# Patient Record
Sex: Male | Born: 2000 | Race: White | Hispanic: No | Marital: Single | Smoking: Current some day smoker
Health system: Southern US, Community
[De-identification: ages and names within clinical notes are randomized; demographics above are authoritative.]

## PROBLEM LIST (undated history)

## (undated) DIAGNOSIS — F419 Anxiety disorder, unspecified: Secondary | ICD-10-CM

---
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
## 2021-03-13 IMAGING — US US ABDOMEN LIMITED
1 series · 14 of 25 positions shown · non-contrast
Comparison: None.

CLINICAL DATA: 18-year-old male with right upper quadrant abdominal
pain.

EXAM:
ULTRASOUND ABDOMEN LIMITED RIGHT UPPER QUADRANT

[Series 1: us abdomen limited · 14 of 70 slices shown]
[im 1/70]
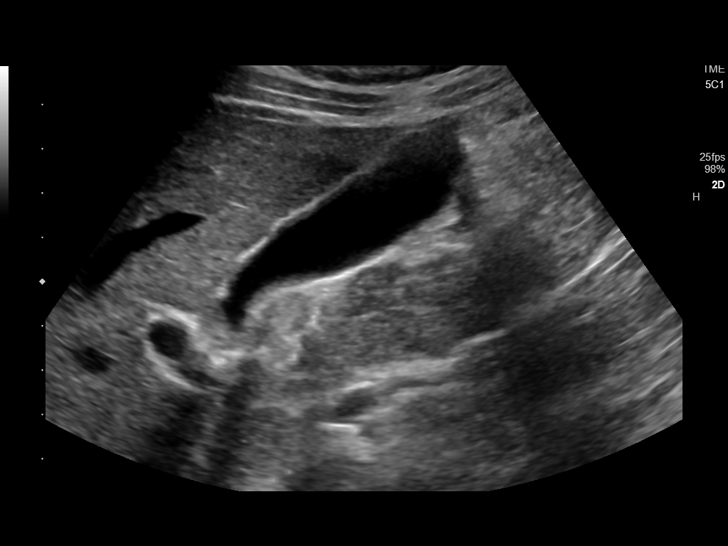
[im 6/70]
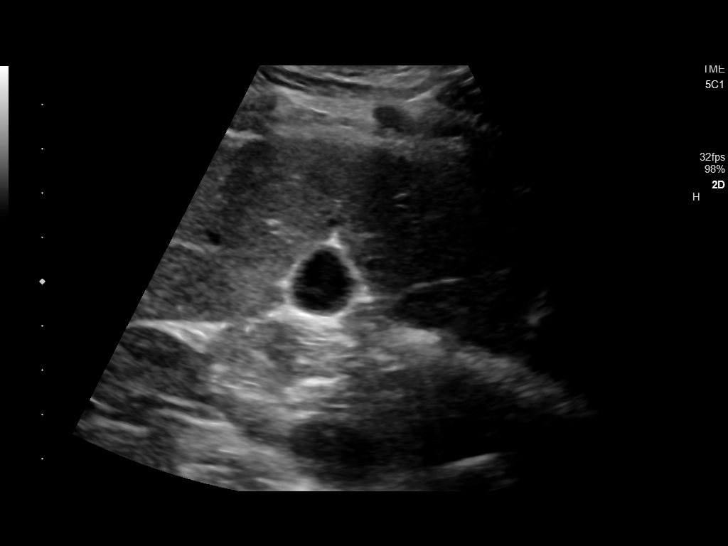
[im 12/70]
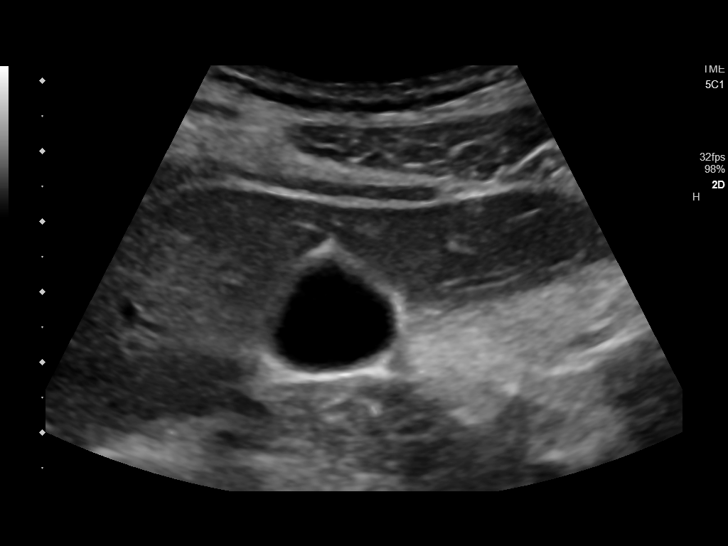
[im 18/70]
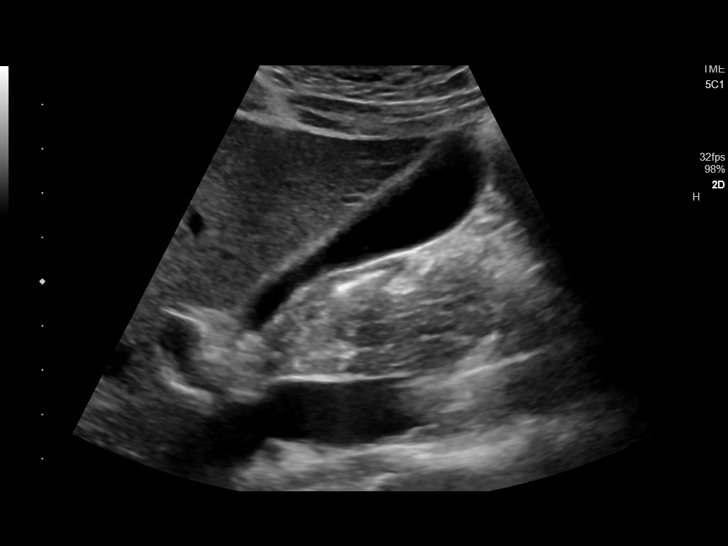
[im 24/70]
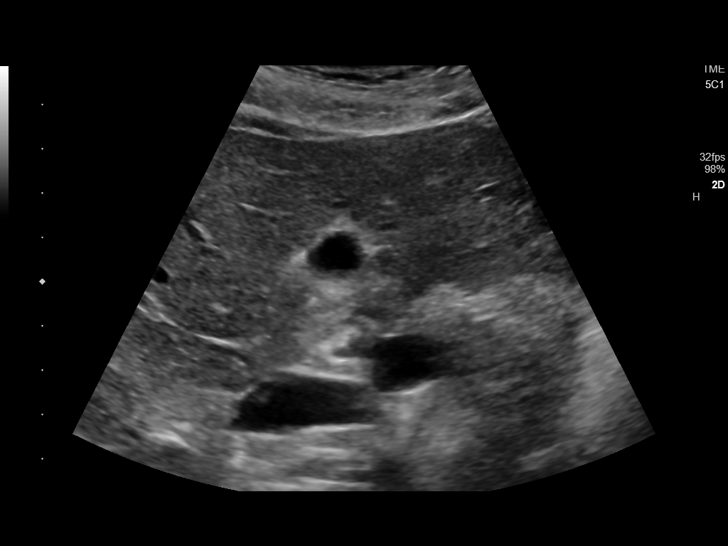
[im 26/70]
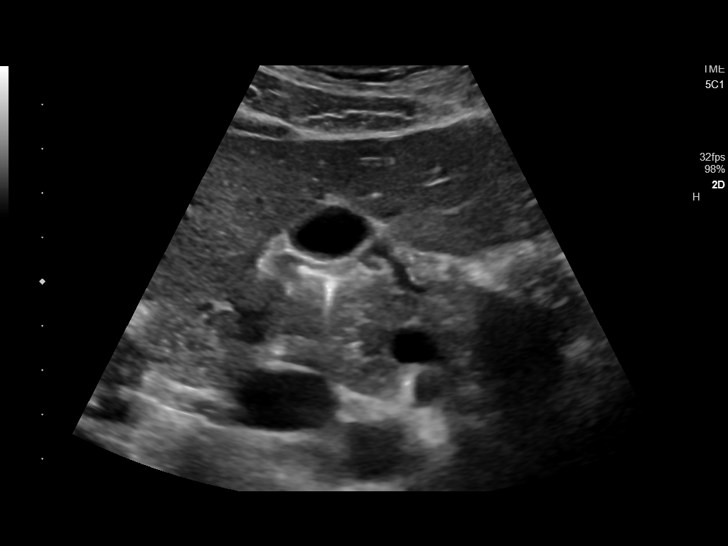
[im 32/70]
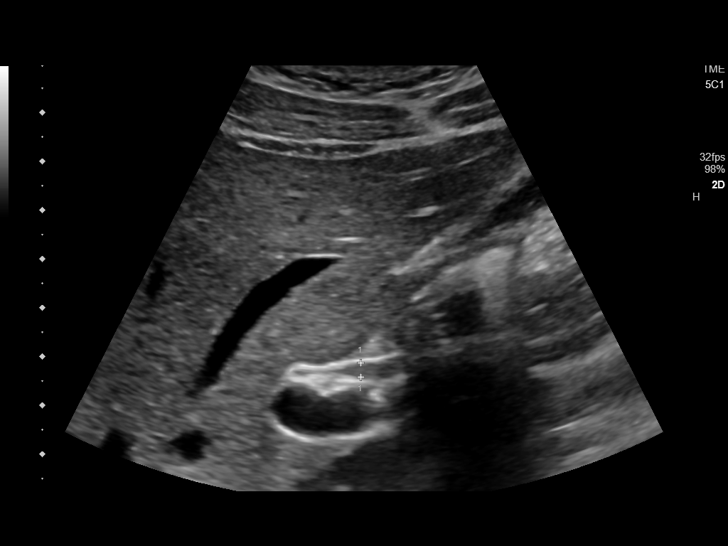
[im 38/70]
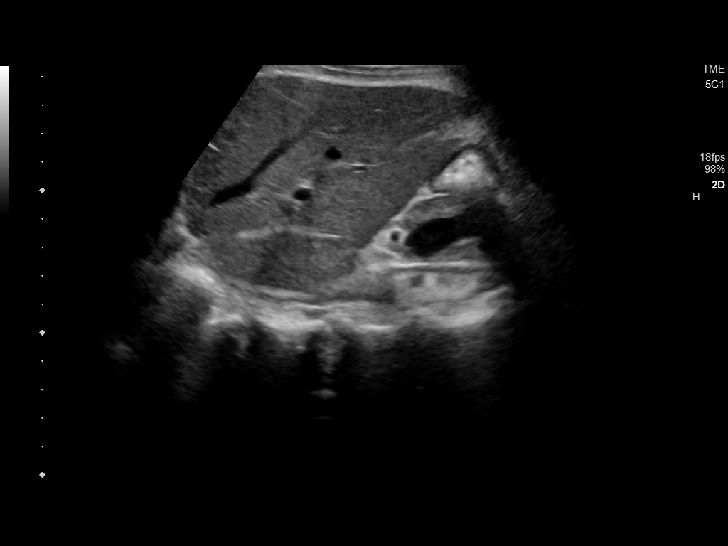
[im 44/70]
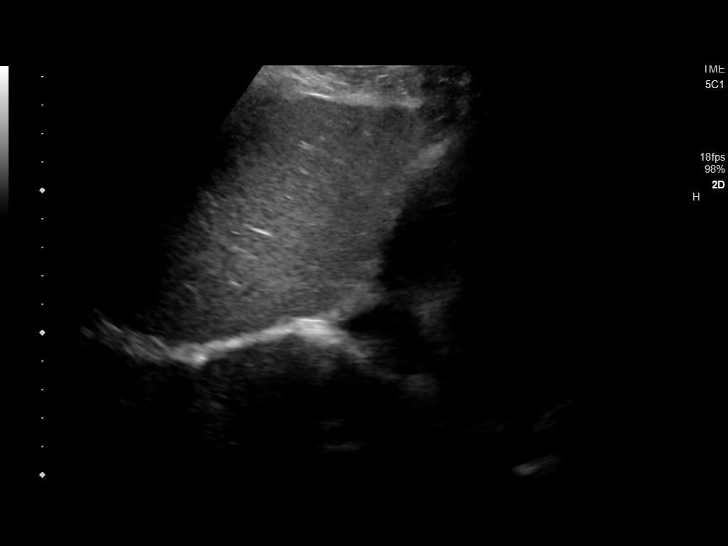
[im 47/70]
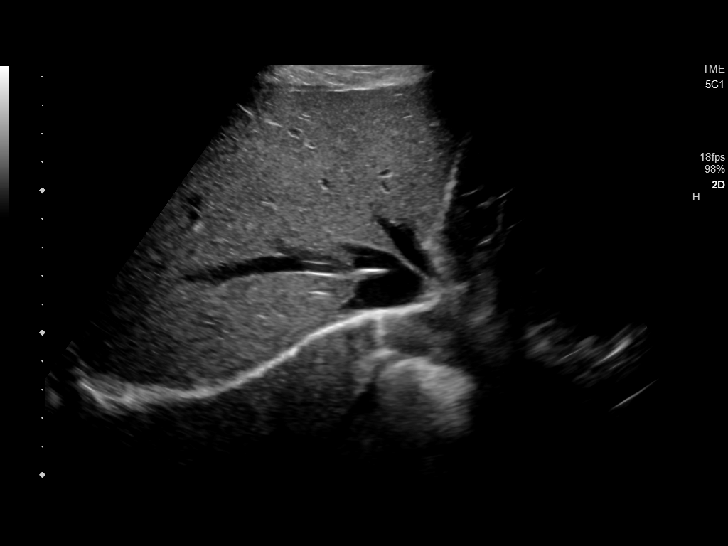
[im 52/70]
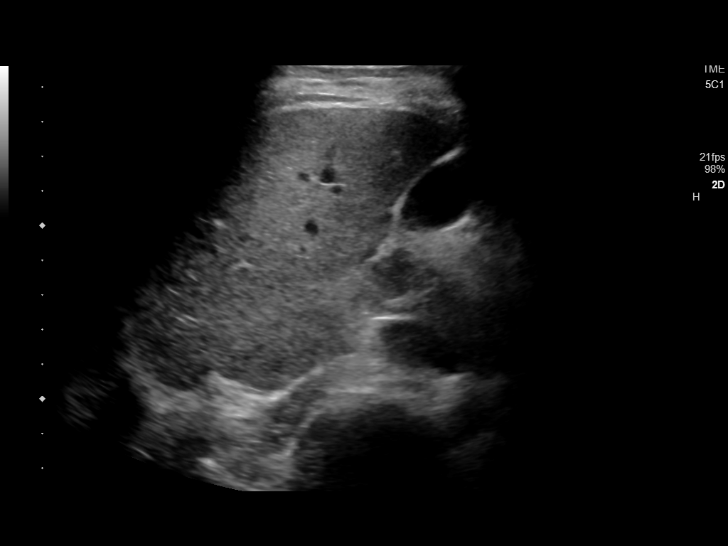
[im 58/70]
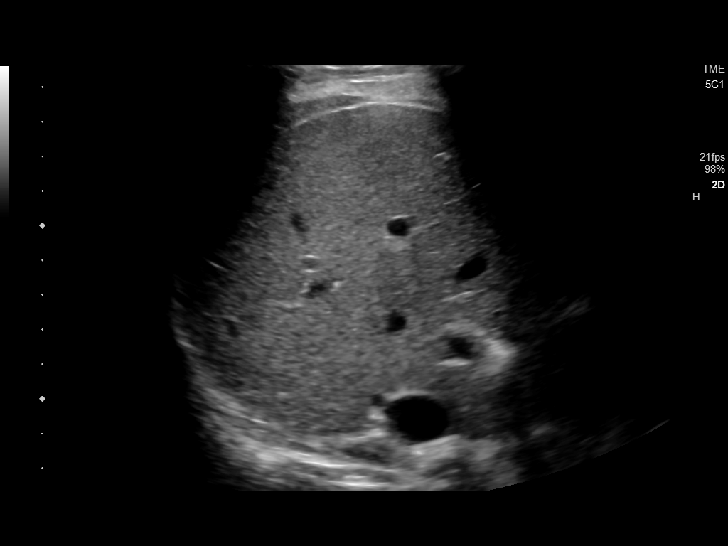
[im 64/70]
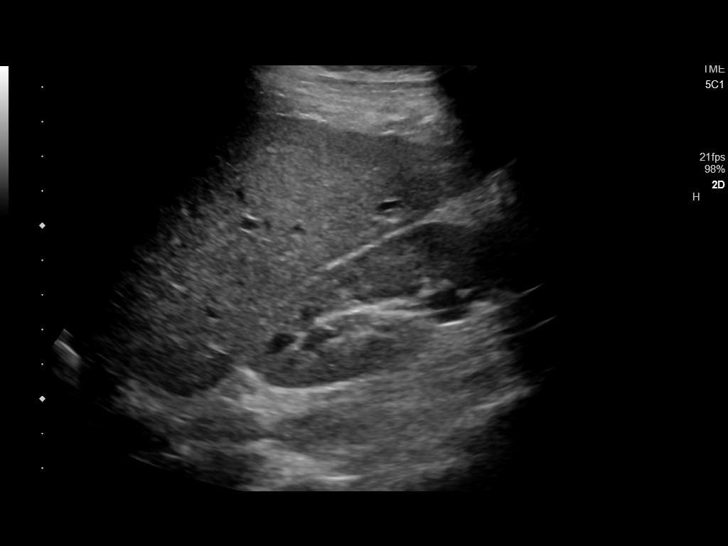
[im 70/70]
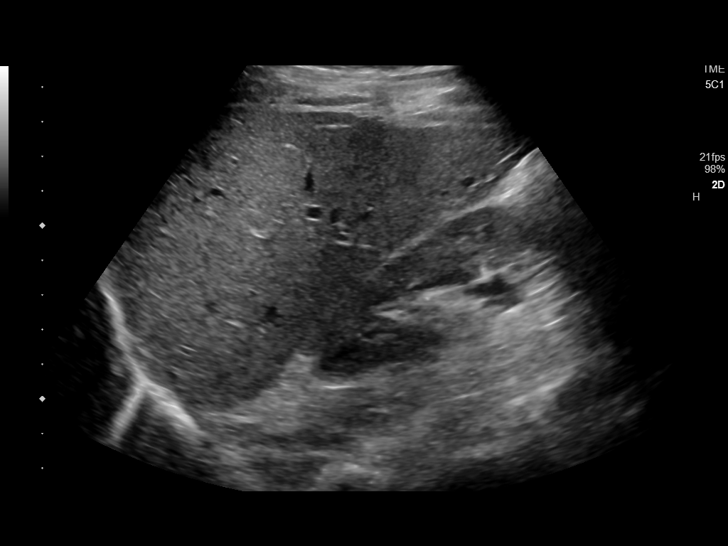

[14 of 25 positions shown; findings below may reference images not displayed]

FINDINGS: Gallbladder:

No gallstones or wall thickening visualized. No sonographic Murphy
sign noted by sonographer.

Common bile duct:

Diameter: 3 mm

Liver:

The liver is unremarkable. Portal vein is patent on color Doppler
imaging with normal direction of blood flow towards the liver.

Other: None.
IMPRESSION: Unremarkable right upper quadrant ultrasound.

## 2022-01-14 ENCOUNTER — Emergency Department (HOSPITAL_COMMUNITY)
Admission: EM | Admit: 2022-01-14 | Discharge: 2022-01-15 | Disposition: A | Discharge status: Home / Self Care | Payer: BLUE CROSS/BLUE SHIELD | Attending: Emergency Medicine | Admitting: Emergency Medicine

## 2022-01-14 ENCOUNTER — Other Ambulatory Visit: Payer: Self-pay

## 2022-01-14 ENCOUNTER — Encounter (HOSPITAL_COMMUNITY): Payer: Self-pay | Admitting: Emergency Medicine

## 2022-01-14 DIAGNOSIS — Z20822 Contact with and (suspected) exposure to covid-19: Secondary | ICD-10-CM | POA: Diagnosis not present

## 2022-01-14 DIAGNOSIS — F321 Major depressive disorder, single episode, moderate: Secondary | ICD-10-CM

## 2022-01-14 DIAGNOSIS — F329 Major depressive disorder, single episode, unspecified: Secondary | ICD-10-CM | POA: Insufficient documentation

## 2022-01-14 DIAGNOSIS — F4329 Adjustment disorder with other symptoms: Secondary | ICD-10-CM

## 2022-01-14 DIAGNOSIS — Z046 Encounter for general psychiatric examination, requested by authority: Secondary | ICD-10-CM | POA: Diagnosis present

## 2022-01-14 DIAGNOSIS — F419 Anxiety disorder, unspecified: Secondary | ICD-10-CM

## 2022-01-14 DIAGNOSIS — R45851 Suicidal ideations: Secondary | ICD-10-CM | POA: Insufficient documentation

## 2022-01-14 DIAGNOSIS — F4322 Adjustment disorder with anxiety: Secondary | ICD-10-CM | POA: Diagnosis not present

## 2022-01-14 LAB — ETHANOL: Alcohol, Ethyl (B): 10 mg/dL — ABNORMAL HIGH (ref <10)

## 2022-01-14 LAB — COMPREHENSIVE METABOLIC PANEL
ALT: 18 U/L (ref 0–44)
AST: 26 U/L (ref 15–41)
Albumin: 4.1 g/dL (ref 3.5–5.0)
Alkaline Phosphatase: 81 U/L (ref 38–126)
Anion gap: 11 (ref 5–15)
BUN: 6 mg/dL (ref 6–20)
CO2: 25 mmol/L (ref 22–32)
Calcium: 9.1 mg/dL (ref 8.9–10.3)
Chloride: 100 mmol/L (ref 98–111)
Creatinine, Ser: 0.89 mg/dL (ref 0.61–1.24)
GFR, Estimated: >60 mL/min (ref >60)
Glucose, Bld: 115 mg/dL — ABNORMAL HIGH (ref 70–99)
Potassium: 3.6 mmol/L (ref 3.5–5.1)
Sodium: 136 mmol/L (ref 135–145)
Total Bilirubin: 0.4 mg/dL (ref 0.3–1.2)
Total Protein: 7.1 g/dL (ref 6.5–8.1)

## 2022-01-14 LAB — RAPID URINE DRUG SCREEN, HOSP PERFORMED
Amphetamines: NOT DETECTED
Barbiturates: NOT DETECTED
Benzodiazepines: NOT DETECTED
Cocaine: NOT DETECTED
Opiates: NOT DETECTED
Tetrahydrocannabinol: NOT DETECTED

## 2022-01-14 LAB — CBC
HCT: 42.3 % (ref 39.0–52.0)
Hemoglobin: 14.6 g/dL (ref 13.0–17.0)
MCH: 32.4 pg (ref 26.0–34.0)
MCHC: 34.5 g/dL (ref 30.0–36.0)
MCV: 94 fL (ref 80.0–100.0)
Platelets: 335 10*3/uL (ref 150–400)
RBC: 4.5 MIL/uL (ref 4.22–5.81)
RDW: 13.2 % (ref 11.5–15.5)
WBC: 7.4 10*3/uL (ref 4.0–10.5)
nRBC: 0 % (ref 0.0–0.2)

## 2022-01-14 LAB — RESP PANEL BY RT-PCR (FLU A&B, COVID) ARPGX2
Influenza A by PCR: NEGATIVE
Influenza B by PCR: NEGATIVE
SARS Coronavirus 2 by RT PCR: NEGATIVE

## 2022-01-14 LAB — ACETAMINOPHEN LEVEL: Acetaminophen (Tylenol), Serum: <10 ug/mL — ABNORMAL LOW (ref 10–30)

## 2022-01-14 LAB — SALICYLATE LEVEL: Salicylate Lvl: <7 mg/dL — ABNORMAL LOW (ref 7.0–30.0)

## 2022-01-14 NOTE — BH Assessment (Signed)
Comprehensive Clinical Assessment (CCA) Note  01/15/2022 Ignace Takemura [id number]  Disposition: Quintella Reichert, NP, recommends overnight observation for safety and stabilization with psych reassessment in the AM. Romelle Starcher, RN, informed of disposition.  The patient demonstrates the following risk factors for suicide: Chronic risk factors for suicide include: psychiatric disorder of anxiety . Acute risk factors for suicide include:  breakup with girlfriend . Protective factors for this patient include: positive social support, responsibility to others (children, family), coping skills, hope for the future, and life satisfaction. Considering these factors, the overall suicide risk at this point appears to be moderate. Patient is appropriate for outpatient follow up.  [facility] from 01/14/2022 in [place] High Risk      Bellamy Beiler is a 21 year old male presenting voluntary to [facility] due to [place] with plan to shoot self or driving off road. Patient reported history of anxiety. Patient reported he was driving his mother to the airport when he experienced SI and a panic attack. Patient shared with mother whom then brought patient to [facility]. Patient reported today was his first time experiencing SI. Patient reported stressors include difficult break up with girlfriend 2 weeks ago of 1.5 years, increased sadness reported. Mother reported that also their family dog died 9 month ago. Patient also admitted to drinking, approximately 10-20 drinks 4 nights weekly, with increased drinking during birthday weekend. Patient denied prior psych hospitalizations, suicide attempts and self-harming behaviors. Patient denied HI and psychosis.   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 Patient resides off campus with 7 housemates. Patient denied access to guns, however expressed that he has friends that do  hunt, mother is aware of this. Patient was pleasant and cooperative during assessment.  Collateral contact, Roee Manly, [phone], patient gave consent to speak with mother. Mother reports patient has a good support system of fraternity brothers, friends and family. Mother reported patient is overwhelmed due to this was patients birthday weekend, lack of sleep and drinking and that patient was overwhelmed with events. Mother feels safe with patient being discharged in the morning stating that she will be here at 8am.   Chief Complaint:  Chief Complaint  Patient presents with   Suicidal   Visit Diagnosis:  Major depressive disorder Hx of Anxiety   CCA Screening, Triage and Referral (STR)  Patient Reported Information How did you hear about [place]? Family/Friend  What Is the Reason for Your Visit/Call Today? Fleeting thoughts of SI.  How Long Has This Been Causing You Problems? <Week  What Do You Feel Would Help You the Most Today? Treatment for Depression or other mood problem   Have You Recently Had Any Thoughts About Hurting Yourself? Yes  Are You Planning to Commit Suicide/Harm Yourself At This time? No   Have you Recently Had Thoughts About [place]? No  Are You Planning to Harm Someone at This Time? No  Explanation: No data recorded  Have You Used Any Alcohol or Drugs in the Past 24 Hours? Yes  How Long Ago Did You Use Drugs or Alcohol? No data recorded What Did You Use and How Much? alcohol, 10-20 drinks   Do You Currently Have a Therapist/Psychiatrist? No  Name of Therapist/Psychiatrist: No data recorded  Have You Been Recently Discharged From Any Office Practice or Programs? No  Explanation of Discharge From Practice/Program: No data recorded    CCA Screening Triage Referral Assessment  Type of Contact: Tele-Assessment  Telemedicine Service Delivery:   Is this Initial or Reassessment? Initial Assessment  Date Telepsych consult ordered in  [facility]:  01/14/22  Time Telepsych consult ordered in [facility]:  1749  [facility]  Provider Location: [facility] Assessment Services   Collateral Involvement: Hilario Quarry, mother [phone]   Does Patient Have a Stage manager Guardian? No data recorded Name and Contact of Legal Guardian: No data recorded If Minor and Not Living with Parent(s), Who has Custody? No data recorded Is CPS involved or ever been involved? Never  Is APS involved or ever been involved? Never   Patient Determined To Be At Risk for Harm To Self or Others Based on Review of Patient Reported Information or Presenting Complaint? No data recorded Method: No data recorded Availability of Means: No data recorded Intent: No data recorded Notification Required: No data recorded Additional Information for Danger to Others Potential: No data recorded Additional Comments for Danger to Others Potential: No data recorded Are There Guns or Other Weapons in Your Home? No data recorded Types of Guns/Weapons: No data recorded Are These Weapons Safely Secured?                            No data recorded Who Could Verify You Are Able To Have These Secured: No data recorded Do You Have any Outstanding Charges, Pending Court Dates, Parole/Probation? No data recorded Contacted To Inform of Risk of Harm To Self or Others: No data recorded   Does Patient Present under Involuntary Commitment? No  IVC Papers Initial File Date: No data recorded  [place] of Residence: [place]   Patient Currently Receiving the Following Services: Not Receiving Services   Determination of Need: Urgent (48 hours)   Options For Referral: Outpatient Therapy; Medication Management     CCA Biopsychosocial Patient Reported Schizophrenia/Schizoaffective Diagnosis in Past: No data recorded  Strengths: self-awareness   Mental Health Symptoms Depression:   Hopelessness   Duration of Depressive symptoms:  Duration of  Depressive Symptoms: Less than two weeks   Mania:   None   Anxiety:    Tension; Sleep; Restlessness   Psychosis:   None   Duration of Psychotic symptoms:    Trauma:   None   Obsessions:   None   Compulsions:   None   Inattention:   None   Hyperactivity/Impulsivity:   None   Oppositional/Defiant Behaviors:   None   Emotional Irregularity:   None   Other Mood/Personality Symptoms:  No data recorded   Mental Status Exam Appearance and self-care  Stature:   Average   Weight:   Average weight   Clothing:   Age-appropriate   Grooming:   Normal   Cosmetic use:   None   Posture/gait:   Normal   Motor activity:   Not Remarkable   Sensorium  Attention:   Normal   Concentration:   Normal   Orientation:   X5   Recall/memory:   Normal   Affect and Mood  Affect:   Appropriate   Mood:   Anxious   Relating  Eye contact:   Normal   Facial expression:   Sad   Attitude toward examiner:   Cooperative   Thought and Language  Speech flow:  Normal   Thought content:   Appropriate to Mood and Circumstances   Preoccupation:   None   Hallucinations:   None   Organization:  No data recorded  [employer] of Knowledge:   Average   Intelligence:   Average   Abstraction:   Normal   Judgement:   Normal   Reality Testing:   Realistic   Insight:   Fair   Decision Making:   Normal   Social Functioning  Social Maturity:   Responsible   Social Judgement:   Normal   Stress  Stressors:   Relationship   Coping Ability:   Human resources officer Deficits:   None   Supports:   Family; Friends/Service system     Religion:    Leisure/Recreation: Leisure / Recreation Do You Have Hobbies?: Yes  Exercise/Diet: Exercise/Diet Do You Exercise?:  (uta) Have You Gained or Lost A Significant Amount of Weight in the Past Six Months?:  (uta) Do You Follow a Special Diet?:  (uta) Do You Have Any Trouble  Sleeping?: Yes Explanation of Sleeping Difficulties: "sleep different times in a day"   CCA Employment/Education Employment/Work Situation: Employment / Work Situation Employment Situation: Consulting civil engineer  Education: Education Is Patient Currently Attending School?: Yes School Currently Attending: [employer] Did Theme park manager?: Yes What Type of College Degree Do you Have?: currently at [employer] Did You Have An Individualized Education Program (IIEP): No Did You Have Any Difficulty At School?: No Patient's Education Has Been Impacted by Current Illness: No   CCA Family/Childhood History Family and Relationship History: Family history Marital status: Single Does patient have children?: No  Childhood History:  Childhood History By whom was/is the patient raised?: Mother Did patient suffer any verbal/emotional/physical/sexual abuse as a child?: No Did patient suffer from severe childhood neglect?: No Has patient ever been sexually abused/assaulted/raped as an adolescent or adult?: No Was the patient ever a victim of a crime or a disaster?: No Witnessed domestic violence?: No  Child/Adolescent Assessment:     CCA Substance Use Alcohol/Drug Use: Alcohol / Drug Use Pain Medications: see MAR Prescriptions: see MAR Over the Counter: see MAR History of alcohol / drug use?: Yes Substance #1 Name of Substance 1: alcohol 1 - Age of First Use: 15 1 - Amount (size/oz): unknown 1 - Frequency: daily                       ASAM's:  Six Dimensions of Multidimensional Assessment  Dimension 1:  Acute Intoxication and/or Withdrawal Potential:      Dimension 2:  Biomedical Conditions and Complications:      Dimension 3:  Emotional, Behavioral, or Cognitive Conditions and Complications:     Dimension 4:  Readiness to Change:     Dimension 5:  Relapse, Continued use, or Continued Problem Potential:     Dimension 6:  Recovery/Living Environment:      ASAM Severity Score:    ASAM Recommended Level of Treatment:     Substance use Disorder (SUD)    Recommendations for Services/Supports/Treatments:    Discharge Disposition:    DSM5 Diagnoses: There are no problems to display for this patient.    Referrals to Alternative Service(s): Referred to Alternative Service(s):   Place:   Date:   Time:    Referred to Alternative Service(s):   Place:   Date:   Time:    Referred to Alternative Service(s):   Place:   Date:   Time:    Referred to Alternative Service(s):   Place:   Date:   Time:     Burnetta Sabin, [facility]

## 2022-01-14 NOTE — ED Notes (Signed)
Pt changed into scrubs.  All belongings bagged and taken by pt's mother to car.  Security wanded pt.

## 2022-01-14 NOTE — ED Triage Notes (Signed)
Pt arrives with his mother.  States he was just driving and had suicidal thoughts with the thoughts of driving off road or shooting himself.  Doesn't have a gun.  States he is going through a hard break-up.  Last ETOH 2am.

## 2022-01-14 NOTE — ED Notes (Signed)
Pt changing into scrubs 

## 2022-01-14 NOTE — ED Provider Notes (Signed)
[facility] EMERGENCY DEPARTMENT Provider Note   CSN: [id number] Arrival date & time: 01/14/22  1641     History  Chief Complaint  Patient presents with   Suicidal    Victor Harris is a 21 y.o. male.  Patient with hx anxiety, c/o recent stressor of dog dying a month ago and recent break up of relationship, and states was having suicial thoughts today of driving off road. Denies attempt to harm self. Denies any med use. Hx anxiety. Denies hx depression. States spoke to counselor/therapist type person one time in past. States generally poor appetite, but states not big eater or big appetite at baseline. Some trouble sleeping at night. No acute wt loss. Denies acute physical illness or symptoms.   The history is provided by the patient and medical records.      Home Medications Prior to Admission medications   Medication Sig Start Date End Date Taking? Authorizing Provider  LORazepam (ATIVAN) 0.5 MG tablet Take 0.5 mg by mouth every 8 (eight) hours as needed for anxiety. Pt says he's only prescribed 2 at a time and takes it before going to see a doctor or have blood drawn    [provider]      Allergies    Penicillins    Review of Systems   Review of Systems  Constitutional:  Negative for fever.  HENT:  Negative for sore throat.   Eyes:  Negative for redness.  Respiratory:  Negative for shortness of breath.   Cardiovascular:  Negative for chest pain.  Gastrointestinal:  Negative for abdominal pain.  Genitourinary:  Negative for flank pain.  Musculoskeletal:  Negative for back pain and neck pain.  Skin:  Negative for rash.  Neurological:  Negative for headaches.  Hematological:  Does not bruise/bleed easily.  Psychiatric/Behavioral:  Positive for dysphoric mood and suicidal ideas. The patient is nervous/anxious.    Physical Exam Updated Vital Signs BP (!) 150/105 (BP Location: Left Arm)    Pulse 74    Temp 98.3 F (36.8 C) (Oral)    Resp 16     SpO2 100%  Physical Exam Vitals and nursing note reviewed.  Constitutional:      Appearance: Normal appearance. He is well-developed.  HENT:     Head: Atraumatic.     Nose: Nose normal.     Mouth/Throat:     Mouth: Mucous membranes are moist.     Pharynx: Oropharynx is clear.  Eyes:     General: No scleral icterus.    Conjunctiva/sclera: Conjunctivae normal.     Pupils: Pupils are equal, round, and reactive to light.  Neck:     Trachea: No tracheal deviation.  Cardiovascular:     Rate and Rhythm: Normal rate and regular rhythm.     Pulses: Normal pulses.     Heart sounds: Normal heart sounds. No murmur heard.   No friction rub. No gallop.  Pulmonary:     Effort: Pulmonary effort is normal. No accessory muscle usage or respiratory distress.     Breath sounds: Normal breath sounds.  Abdominal:     General: Bowel sounds are normal. There is no distension.     Palpations: Abdomen is soft.     Tenderness: There is no abdominal tenderness.  Genitourinary:    Comments: No cva tenderness. Musculoskeletal:        General: No swelling.     Cervical back: Normal range of motion and neck supple. No rigidity.  Skin:  General: Skin is warm and dry.     Findings: No rash.  Neurological:     Mental Status: He is alert.     Comments: Alert, speech clear. Steady gait.   Psychiatric:     Comments: Depressed mood, flat affect. + suicidal ideation.     ED Results / Procedures / Treatments   Labs (all labs ordered are listed, but only abnormal results are displayed) Results for orders placed or performed during the hospital encounter of 01/14/22  Comprehensive metabolic panel  Result Value Ref Range   Sodium 136 135 - 145 mmol/L   Potassium 3.6 3.5 - 5.1 mmol/L   Chloride 100 98 - 111 mmol/L   CO2 25 22 - 32 mmol/L   Glucose, Bld 115 (H) 70 - 99 mg/dL   BUN 6 6 - 20 mg/dL   Creatinine, Ser 4.96 0.61 - 1.24 mg/dL   Calcium 9.1 8.9 - 75.9 mg/dL   Total Protein 7.1 6.5 - 8.1  g/dL   Albumin 4.1 3.5 - 5.0 g/dL   AST 26 15 - 41 U/L   ALT 18 0 - 44 U/L   Alkaline Phosphatase 81 38 - 126 U/L   Total Bilirubin 0.4 0.3 - 1.2 mg/dL   GFR, Estimated >16 >38 mL/min   Anion gap 11 5 - 15  Ethanol  Result Value Ref Range   Alcohol, Ethyl (B) 10 (H) <10 mg/dL  Salicylate level  Result Value Ref Range   Salicylate Lvl <7.0 (L) 7.0 - 30.0 mg/dL  Acetaminophen level  Result Value Ref Range   Acetaminophen (Tylenol), Serum <10 (L) 10 - 30 ug/mL  cbc  Result Value Ref Range   WBC 7.4 4.0 - 10.5 K/uL   RBC 4.50 4.22 - 5.81 MIL/uL   Hemoglobin 14.6 13.0 - 17.0 g/dL   HCT 46.6 59.9 - 35.7 %   MCV 94.0 80.0 - 100.0 fL   MCH 32.4 26.0 - 34.0 pg   MCHC 34.5 30.0 - 36.0 g/dL   RDW 01.7 79.3 - 90.3 %   Platelets 335 150 - 400 K/uL   nRBC 0.0 0.0 - 0.2 %     EKG None  Radiology No results found.  Procedures Procedures    Medications Ordered in ED Medications - No data to display  ED Course/ Medical Decision Making/ A&P                           Medical Decision Making Problems Addressed: Adjustment reaction with predominant disturbance of emotions: acute illness or injury that poses a threat to life or bodily functions Anxiety: chronic illness or injury with exacerbation, progression, or side effects of treatment Episode of moderate major depression (HCC): acute illness or injury that poses a threat to life or bodily functions  Amount and/or Complexity of Data Reviewed External Data Reviewed: notes. Labs: ordered. Decision-making details documented in ED Course. Discussion of management or test interpretation with external provider(s): BH/TTS team consulted, pt/symptoms discussed.   Risk Decision regarding hospitalization.   Labs sent.  Dispo including possible admission considered - will reassess post BH eval.   Reviewed nursing notes and prior charts for additional history.  External reports reviewed.   Labs reviewed/interpreted by me - chem  normal, wbc normal.   [facility] team consulted.   The patient has been placed in psychiatric observation due to the need to provide a safe environment for the patient while obtaining psychiatric consultation and evaluation,  as well as ongoing medical and medication management to treat the patient's condition.  The patient has not been placed under full IVC at this time.  BH eval pending.  Disposition per [facility] team.          Final Clinical Impression(s) / ED Diagnoses Final diagnoses:  None    Rx / DC Orders ED Discharge Orders     None         Cathren Laine, MD 01/14/22 1839

## 2022-01-15 DIAGNOSIS — F4322 Adjustment disorder with anxiety: Secondary | ICD-10-CM

## 2022-01-15 MED ORDER — PANTOPRAZOLE SODIUM 40 MG PO TBEC
40.0000 mg | DELAYED_RELEASE_TABLET | Freq: Every day | ORAL | Status: DC
  Administered 2022-01-15: 40 mg via ORAL
  Filled 2022-01-15: qty 1

## 2022-01-15 MED ORDER — NICOTINE 21 MG/24HR TD PT24
21.0000 mg | MEDICATED_PATCH | Freq: Once | TRANSDERMAL | Status: DC
  Administered 2022-01-15: 21 mg via TRANSDERMAL
  Filled 2022-01-15: qty 1

## 2022-01-15 NOTE — Discharge Instructions (Addendum)
For your behavioral health needs you are advised to follow up with the Counseling Center at [facility]:       [facility]      [facility]      [address]      [place], [place] [address]      [phone]

## 2022-01-15 NOTE — BH Assessment (Signed)
[facility] Assessment Progress Note   Per Merlyn Lot, NP, this pt does not require psychiatric hospitalization at this time.  Pt is psychiatrically cleared.  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  EDP Pattricia Boss, NP and pt's nurse, Claiborne Billings, have been notified.  Jalene Mullet, [place] Triage Specialist [phone]

## 2022-01-15 NOTE — ED Provider Notes (Signed)
Emergency Medicine Observation Re-evaluation Note  Victor Harris is a 21 y.o. male, seen on rounds today.  Pt initially presented to the ED for complaints of Suicidal Currently, the patient is in the ED for psychiatric evaluation.  Physical Exam  BP 132/77    Pulse 70    Temp 98.9 F (37.2 C) (Oral)    Resp 15    SpO2 99%  Physical Exam General: wdwn Cardiac: rrr Lungs: no dyspnea Psych: improved  ED Course / MDM  EKG:   I have reviewed the labs performed to date as well as medications administered while in observation.  Recent changes in the last 24 hours include patient seen by bh team x 2 .  Plan  Current plan is for plan d/c with op f/u.  Victor Harris is not under involuntary commitment.     Margarita Grizzle, MD 01/15/22 [phone]

## 2022-01-15 NOTE — ED Notes (Signed)
Breakfast orders placed 

## 2022-01-15 NOTE — Consult Note (Signed)
Telepsych Consultation   Reason for Consult:  Psychiatric Re-evaluation for suicidal ideations Referring Physician:  Dr. Cathren Laine Location of Patient:    [facility] Location of Provider: Other: virtual home office  Patient Identification: Victor Harris MRN:  [id number] Principal Diagnosis: Adjustment disorder with anxiety Diagnosis:  Principal Problem:   Adjustment disorder with anxiety   Total Time spent with patient: 30 minutes  Subjective:   Victor Harris is a 21 y.o. male patient admitted with anxiety and suicidal ideations.  Patient states today, "I'm doing much better, I don't have any thoughts of hurting myself."    HPI:   Patient seen via telepsych by this provider; chart reviewed and consulted with Dr. Lucianne Muss on 01/15/22.  On evaluation Victor Harris reports most of what was previously obtained in [facility] assessment.  Reports psychosocial stressors, recent relationship break-up, loss of a family pet.  Patient states he recently turned 21 and has been drinking more with friends at school and not sleeping as much.  States above culminated with anxiety attacks and suicidal ideations that lead to current admission. After spending the night in the hospital, and getting caught up on sleep and talking with his mother, he relates his anxiety has improved and he no longer has suicidal ideations, no plan or intent for self harm.  He acknowledges he needs to "slow down" on the alcohol and we reviewed how this can exacerbate anxiety, depression and contribute to impulsiveness. 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  Endorses career goals, friends and his family as protective factors against self harm.  He is future oriented and agrees to follow-up with outpatient resources for therapy.  Of note, pt states the breakup was mutual and they remain on good terms.  Patient allows this writer to speak with mother who remained outside the exam room during assessment.  Ms. Kelton Kozub, reports her son "sounds like himself and looks like the color has returned to his body.  He looks good today."  She states she does not have safety concerns with her son being discharged today.  States he can follow-up with [facility] for on campus therapy or she will assist in getting outpatient private services set up.     Past Psychiatric History: As outlined above  Risk to Self:  no Risk to Others:  no Prior Inpatient Therapy:  no Prior Outpatient Therapy:  one outpatient session  Past Medical History:  Past Medical History:  Diagnosis Date   Anxiety    History reviewed. No pertinent surgical history. Family History: No family history on file. Family Psychiatric  History: unknown Social History:  Social History   Substance and Sexual Activity  Alcohol Use Yes     Social History   Substance and Sexual Activity  Drug Use Yes   Types: Marijuana    Social History   Socioeconomic History   Marital status: Single    Spouse name: Not on file   Number of children: Not on file   Years of education: Not on file   Highest education level: Not on file  Occupational History   Not on file  Tobacco Use   Smoking status: Some Days   Smokeless tobacco: Never  Substance and Sexual Activity   Alcohol use: Yes   Drug use: Yes    Types: Marijuana   Sexual activity: Not on file  Other Topics Concern   Not on file  Social History Narrative   Not on file   Social  Determinants of Health   Financial Resource Strain: Not on file  Food Insecurity: Not on file  Transportation Needs: Not on file  Physical Activity: Not on file  Stress: Not on file  Social Connections: Not on file   Additional Social History:    Allergies:   Allergies  Allergen Reactions   Penicillins Rash    Labs:  Results for orders placed or performed during the hospital encounter of 01/14/22 (from the past 48 hour(s))  Comprehensive metabolic panel     Status: Abnormal   Collection Time: 01/14/22   4:51 PM  Result Value Ref Range   Sodium 136 135 - 145 mmol/L   Potassium 3.6 3.5 - 5.1 mmol/L   Chloride 100 98 - 111 mmol/L   CO2 25 22 - 32 mmol/L   Glucose, Bld 115 (H) 70 - 99 mg/dL    Comment: Glucose reference range applies only to samples taken after fasting for at least 8 hours.   BUN 6 6 - 20 mg/dL   Creatinine, Ser 2.84 0.61 - 1.24 mg/dL   Calcium 9.1 8.9 - 13.2 mg/dL   Total Protein 7.1 6.5 - 8.1 g/dL   Albumin 4.1 3.5 - 5.0 g/dL   AST 26 15 - 41 U/L   ALT 18 0 - 44 U/L   Alkaline Phosphatase 81 38 - 126 U/L   Total Bilirubin 0.4 0.3 - 1.2 mg/dL   GFR, Estimated >44 >01 mL/min    Comment: (NOTE) Calculated using the CKD-EPI Creatinine Equation (2021)    Anion gap 11 5 - 15    Comment: Performed at [facility], [address], [place], [place] [address]  Ethanol     Status: Abnormal   Collection Time: 01/14/22  4:51 PM  Result Value Ref Range   Alcohol, Ethyl (B) 10 (H) <10 mg/dL    Comment: (NOTE) Lowest detectable limit for serum alcohol is 10 mg/dL.  For medical purposes only. Performed at [facility], [address] [place], [place] [address]   Salicylate level     Status: Abnormal   Collection Time: 01/14/22  4:51 PM  Result Value Ref Range   Salicylate Lvl <7.0 (L) 7.0 - 30.0 mg/dL    Comment: Performed at [facility], [address] [place], [place] [address]  Acetaminophen level     Status: Abnormal   Collection Time: 01/14/22  4:51 PM  Result Value Ref Range   Acetaminophen (Tylenol), Serum <10 (L) 10 - 30 ug/mL    Comment: (NOTE) Therapeutic concentrations vary significantly. A range of 10-30 ug/mL  may be an effective concentration for many patients. However, some  are best treated at concentrations outside of this range. Acetaminophen concentrations >150 ug/mL at 4 hours after ingestion  and >50 ug/mL at 12 hours after ingestion are often associated with  toxic reactions.  Performed at [facility], [address] [place], [place] [address]   cbc     Status: None   Collection Time: 01/14/22  4:51 PM  Result Value Ref Range   WBC 7.4 4.0 - 10.5 K/uL   RBC 4.50 4.22 - 5.81 MIL/uL   Hemoglobin 14.6 13.0 - 17.0 g/dL   HCT 38.7 56.4 - 33.2 %   MCV 94.0 80.0 - 100.0 fL   MCH 32.4 26.0 - 34.0 pg   MCHC 34.5 30.0 - 36.0 g/dL   RDW 95.1 88.4 - 16.6 %   Platelets 335 150 - 400  K/uL   nRBC 0.0 0.0 - 0.2 %    Comment: Performed at [facility], [address] [place], [place] [address]  Rapid urine drug screen (hospital performed)     Status: None   Collection Time: 01/14/22  5:44 PM  Result Value Ref Range   Opiates NONE DETECTED NONE DETECTED   Cocaine NONE DETECTED NONE DETECTED   Benzodiazepines NONE DETECTED NONE DETECTED   Amphetamines NONE DETECTED NONE DETECTED   Tetrahydrocannabinol NONE DETECTED NONE DETECTED   Barbiturates NONE DETECTED NONE DETECTED    Comment: (NOTE) DRUG SCREEN FOR MEDICAL PURPOSES ONLY.  IF CONFIRMATION IS NEEDED FOR ANY PURPOSE, NOTIFY LAB WITHIN 5 DAYS.  LOWEST DETECTABLE LIMITS FOR URINE DRUG SCREEN Drug Class                     Cutoff (ng/mL) Amphetamine and metabolites    1000 Barbiturate and metabolites    200 Benzodiazepine                 200 Tricyclics and metabolites     300 Opiates and metabolites        300 Cocaine and metabolites        300 THC                            50 Performed at [facility], [address] [place], [place] [address]   Resp Panel by RT-PCR (Flu A&B, Covid) Nasopharyngeal Swab     Status: None   Collection Time: 01/14/22 10:11 PM   Specimen: Nasopharyngeal Swab; Nasopharyngeal(NP) swabs in vial transport medium  Result Value Ref Range   SARS Coronavirus 2 by RT PCR NEGATIVE NEGATIVE    Comment: (NOTE) SARS-CoV-2 target nucleic acids are NOT DETECTED.  The SARS-CoV-2 RNA is generally detectable in upper respiratory specimens during the acute phase of infection. The lowest concentration of SARS-CoV-2  viral copies this assay can detect is 138 copies/mL. A negative result does not preclude SARS-Cov-2 infection and should not be used as the sole basis for treatment or other patient management decisions. A negative result may occur with  improper specimen collection/handling, submission of specimen other than nasopharyngeal swab, presence of viral mutation(s) within the areas targeted by this assay, and inadequate number of viral copies(<138 copies/mL). A negative result must be combined with clinical observations, patient history, and epidemiological information. The expected result is Negative.  Fact Sheet for Patients:  BloggerCourse.comhttps://www.fda.gov/media/152166/download  Fact Sheet for Healthcare Providers:  SeriousBroker.ithttps://www.fda.gov/media/152162/download  This test is no t yet approved or cleared by the [place] FDA and  has been authorized for detection and/or diagnosis of SARS-CoV-2 by FDA under an Emergency Use Authorization (EUA). This EUA will remain  in effect (meaning this test can be used) for the duration of the COVID-19 declaration under Section 564(b)(1) of the Act, 21 U.S.C.section 360bbb-3(b)(1), unless the authorization is terminated  or revoked sooner.       Influenza A by PCR NEGATIVE NEGATIVE   Influenza B by PCR NEGATIVE NEGATIVE    Comment: (NOTE) The Xpert Xpress SARS-CoV-2/FLU/RSV plus assay is intended as an aid in the diagnosis of influenza from Nasopharyngeal swab specimens and should not be used as a sole basis for treatment. Nasal washings and aspirates are unacceptable for Xpert Xpress SARS-CoV-2/FLU/RSV testing.  Fact Sheet for Patients: BloggerCourse.comhttps://www.fda.gov/media/152166/download  Fact Sheet for Healthcare Providers: SeriousBroker.ithttps://www.fda.gov/media/152162/download  This test is not yet approved  or cleared by the [place] FDA and has been authorized for detection and/or diagnosis of SARS-CoV-2 by FDA under an Emergency Use Authorization (EUA). This EUA  will remain in effect (meaning this test can be used) for the duration of the COVID-19 declaration under Section 564(b)(1) of the Act, 21 U.S.C. section 360bbb-3(b)(1), unless the authorization is terminated or revoked.  Performed at [facility], [address] [place], [place] [address]     Medications:  Current Facility-Administered Medications  Medication Dose Route Frequency Provider Last Rate Last Admin   nicotine (NICODERM CQ - dosed in mg/24 hours) patch 21 mg  21 mg Transdermal Once Margarita Grizzleay, Danielle, MD   21 mg at 01/15/22 0955   pantoprazole (PROTONIX) EC tablet 40 mg  40 mg Oral Daily Margarita Grizzleay, Danielle, MD   40 mg at 01/15/22 [id number]   Current Outpatient Medications  Medication Sig Dispense Refill   albuterol (VENTOLIN HFA) 108 (90 Base) MCG/ACT inhaler Inhale 2 puffs into the lungs every 4 (four) hours as needed for cough.     fluticasone (FLONASE) 50 MCG/ACT nasal spray Place 2 sprays into the nose as needed for congestion.     omeprazole (PRILOSEC) 20 MG capsule Take 20 mg by mouth daily.     LORazepam (ATIVAN) 0.5 MG tablet Take 0.5 mg by mouth every 8 (eight) hours as needed for anxiety. Pt says he's only prescribed 2 at a time and takes it before going to see a doctor or have blood drawn (Patient not taking: Reported on 01/14/2022)      Musculoskeletal: Strength & Muscle Tone: within normal limits Gait & Station: normal Patient leans: N/A          Psychiatric Specialty Exam:  Presentation  General Appearance: Appropriate for Environment; Casual  Eye Contact:Good  Speech:Clear and Coherent; Normal Rate  Speech Volume:Normal  Handedness:Right   Mood and Affect  Mood:Euthymic  Affect:Appropriate; Congruent   Thought Process  Thought Processes:Coherent; Goal Directed  Descriptions of Associations:Intact  Orientation:Full (Time, Place and Person)  Thought Content:Logical (has improved since admission and "rest")  History of  Schizophrenia/Schizoaffective disorder:No data recorded Duration of Psychotic Symptoms:No data recorded Hallucinations:Hallucinations: None  Ideas of Reference:None  Suicidal Thoughts:Suicidal Thoughts: No (has improved since admission and "rest")  Homicidal Thoughts:Homicidal Thoughts: No   Sensorium  Memory:Immediate Good; Recent Good; Remote Good  Judgment:Good  Insight:Good   Executive Functions  Concentration:Good  Attention Span:Good  Recall:Good  Fund of Knowledge:Good  Language:Good   Psychomotor Activity  Psychomotor Activity:Psychomotor Activity: Normal   Assets  Assets:Communication Skills; Financial Resources/Insurance; Desire for Improvement; Housing; Social Support; Vocational/Educational   Sleep  Sleep:Sleep: Good Number of Hours of Sleep: 7    Physical Exam: Physical Exam Constitutional:      Appearance: Normal appearance.  Cardiovascular:     Rate and Rhythm: Normal rate.     Pulses: Normal pulses.  Pulmonary:     Effort: Pulmonary effort is normal.  Musculoskeletal:     Cervical back: Normal range of motion.  Neurological:     General: No focal deficit present.     Mental Status: He is alert and oriented to person, place, and time.  Psychiatric:        Mood and Affect: Mood normal.        Behavior: Behavior normal.        Thought Content: Thought content normal.        Judgment: Judgment normal.   Review of Systems  Constitutional: Negative.  HENT: Negative.    Eyes: Negative.   Respiratory: Negative.    Cardiovascular: Negative.   Gastrointestinal: Negative.   Genitourinary: Negative.   Skin: Negative.   Neurological:  Negative for tingling, tremors and headaches.  Endo/Heme/Allergies: Negative.   Psychiatric/Behavioral:  Negative for depression, hallucinations, substance abuse and suicidal ideas (has improved since sleep). The patient is not nervous/anxious and does not have insomnia.   Blood pressure 132/77, pulse 70,  temperature 98.9 F (37.2 C), temperature source Oral, resp. rate 15, SpO2 99 %. There is no height or weight on file to calculate BMI.  Treatment Plan Summary: Patient no longer endorses, suicidal ideations, plan or intent.  He contracts for safety and agrees to referral for outpatient therapy on school campus; if unavailable, pts mother agrees to assist in getting him set up with private practice for counseling.  Patient does not believe alcoholism is a problem for him and declines SUD referral. I have asked SW to include resources in discharge AVS.   Plan- As per above assessment, there are no current grounds for involuntary commitment at this time.  Patient is not currently interested in inpatient services but expresses agreement to continue outpatient treatment., we have reviewed importance of substance abuse abstinence, potential negative impact substance abuse can have on his relationships and level of functioning, and importance of medication compliance.  Disposition: No evidence of imminent risk to self or others at present.   Patient does not meet criteria for psychiatric inpatient admission. Supportive therapy provided about ongoing stressors. Discussed crisis plan, support from social network, calling 911, coming to the Emergency Department, and calling Suicide Hotline.  This service was provided via telemedicine using a 2-way, interactive audio and video technology.  Names of all persons participating in this telemedicine service and their role in this encounter. Name: Victor Harris Role: Patient  Name: Cathi Roan Role: Patient's mother  Name: Ophelia Shoulder Role: PMHNP  Name: Dr. Nelly Rout Role: Psychiatrist    Chales Abrahams, NP 01/15/2022 1:37 PM

## 2022-09-12 ENCOUNTER — Other Ambulatory Visit: Payer: Self-pay

## 2022-09-12 ENCOUNTER — Ambulatory Visit (INDEPENDENT_AMBULATORY_CARE_PROVIDER_SITE_OTHER): Payer: BLUE CROSS/BLUE SHIELD | Admitting: Medical

## 2022-09-12 ENCOUNTER — Encounter: Payer: Self-pay | Admitting: Medical

## 2022-09-12 VITALS — BP 122/78 | HR 80 | Temp 98.2°F | Ht 70.47 in | Wt 127.0 lb

## 2022-09-12 DIAGNOSIS — Z113 Encounter for screening for infections with a predominantly sexual mode of transmission: Secondary | ICD-10-CM | POA: Diagnosis not present

## 2022-09-12 DIAGNOSIS — R300 Dysuria: Secondary | ICD-10-CM

## 2022-09-12 LAB — POCT URINALYSIS DIPSTICK (MANUAL)
Leukocytes, UA: NEGATIVE
Nitrite, UA: NEGATIVE
Poct Bilirubin: NEGATIVE
Poct Blood: NEGATIVE
Poct Glucose: NORMAL mg/dL
Poct Ketones: NEGATIVE
Poct Protein: NEGATIVE mg/dL
Poct Urobilinogen: NORMAL mg/dL
Spec Grav, UA: <=1.005 — AB (ref 1.010–1.025)
pH, UA: 6 (ref 5.0–8.0)

## 2022-09-12 NOTE — Progress Notes (Signed)
[address] [place], [place] [address] Phone: [phone] Fax: [phone]   Office Visit Note  Patient Name: Victor Harris  Date of [id number]  Med Rec number [id number]  Date of Service: 09/12/2022  Allergies: Penicillins  Chief Complaint  Patient presents with   sick     HPI 21 YO male presents with pain with urination.  Began to note pain with urination to tip of penis 5 days ago, has gotten gradually better, has not had pain today. Denies penile d/c. Denies redness or swelling to tip of penis. Perhaps mild pain to lower abdomen accompanying pain to penis. Denies fever or chills. Denies nausea or vomiting.  Sexually active with male partners. Had new partner about 6-7 days ago. Does not use condoms, vaginal and oral sex. Last STI testing April/May 2023 was negative. No changes (i.e. odor, color) noted to urine. No increased frequency or urgency.    Current Medication:  Outpatient Encounter Medications as of 09/12/2022  Medication Sig Note   [DISCONTINUED] albuterol (VENTOLIN HFA) 108 (90 Base) MCG/ACT inhaler Inhale 2 puffs into the lungs every 4 (four) hours as needed for cough. (Patient not taking: Reported on 09/12/2022)    [DISCONTINUED] fluticasone (FLONASE) 50 MCG/ACT nasal spray Place 2 sprays into the nose as needed for congestion. (Patient not taking: Reported on 09/12/2022)    [DISCONTINUED] LORazepam (ATIVAN) 0.5 MG tablet Take 0.5 mg by mouth every 8 (eight) hours as needed for anxiety. Pt says he's only prescribed 2 at a time and takes it before going to see a doctor or have blood drawn (Patient not taking: Reported on 01/14/2022) 01/14/2022: Pt reports not having any in over 2 years, states he thinks he may have one somewhere at home just in case   [DISCONTINUED] omeprazole (PRILOSEC) 20 MG capsule Take 20 mg by mouth daily. (Patient not taking: Reported on 09/12/2022)    No facility-administered encounter medications on file as of  09/12/2022.      Medical History: Past Medical History:  Diagnosis Date   Anxiety      Vital Signs: BP 122/78   Pulse 80   Temp 98.2 F (36.8 C) (Tympanic)   Ht 5' 10.47" (1.79 m)   Wt 127 lb (57.6 kg)   SpO2 99%   BMI 17.98 kg/m    Review of Systems  Constitutional:  Negative for chills and fever.  Gastrointestinal:  Positive for abdominal pain (resolved). Negative for nausea and vomiting.  Genitourinary:  Positive for dysuria (improving, none experienced today) and penile pain. Negative for flank pain, frequency, genital sores, hematuria, penile discharge, penile swelling, scrotal swelling, testicular pain and urgency.    Physical Exam Vitals reviewed.  Constitutional:      General: He is not in acute distress.    Appearance: He is not ill-appearing.  Abdominal:     Palpations: Abdomen is soft.     Tenderness: There is no abdominal tenderness.  Neurological:     Mental Status: He is alert.    Offered option for genital exam, patient declined (states no visible changes).  POCT Urinalysis NORMAL; Negative for blood, leukocyte esterase, nitrite, glucose, protein and ketones.  Assessment/Plan: 1. Dysuria 2. Screening examination for STD (sexually transmitted disease) Discussed reassuring result of dipstick urinalysis. Also reassuring that symptoms currently resolved. Will send urine samples for culture and STI screening. Discussed possibility of brief irritation related to recent sexual activities.  - Chlamydia/Gonococcus/Trichomonas, NAA - POCT Urinalysis Dip Manual - Urine Culture  Patient  Instructions:  -Drink plenty of water next few days. -Use condoms consistently for all forms of sex (i.e. vaginal, oral, anal).  -You will receive a MyChart message notifying you of your lab results when they are available.   -Send my chart message to provider in meantime as needed for new/worsening/returning symptoms.  General Counseling: keenon leitzel  understanding of the findings of todays visit and agrees with plan of treatment.  he has been encouraged to call the office with any questions or concerns that should arise related to todays visit.   No orders of the defined types were placed in this encounter.   No orders of the defined types were placed in this encounter.   Time spent:20 Minutes    Jonathon Resides PA-C [facility] 09/12/2022 4:01 PM

## 2022-09-14 LAB — URINE CULTURE: Organism ID, Bacteria: NO GROWTH

## 2022-09-14 LAB — CHLAMYDIA/GONOCOCCUS/TRICHOMONAS, NAA
Chlamydia by NAA: NEGATIVE
Gonococcus by NAA: NEGATIVE
Trich vag by NAA: NEGATIVE

## 2022-09-16 ENCOUNTER — Encounter: Payer: Self-pay | Admitting: Medical

## 2022-09-16 NOTE — Patient Instructions (Signed)
-  Drink plenty of water next few days. -Use condoms consistently for all forms of sex (i.e. vaginal, oral, anal).  -You will receive a MyChart message notifying you of your lab results when they are available.   -Send my chart message to provider in meantime as needed for new/worsening/returning symptoms.
